# Patient Record
Sex: Male | Born: 2000 | Race: Black or African American | Hispanic: No | Marital: Single | Smoking: Never smoker
Health system: Southern US, Community
[De-identification: ages and names within clinical notes are randomized; demographics above are authoritative.]

---
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
## 2019-02-07 IMAGING — DX DG TIBIA/FIBULA 2V*L*
4 series · 4 of 4 positions shown · non-contrast
Comparison: None.

CLINICAL DATA: Patient hit left leg in a week ago. Left leg
swelling and painful to touch.

EXAM:
LEFT TIBIA AND FIBULA - 2 VIEW

[tibia ap (1 of 2)]
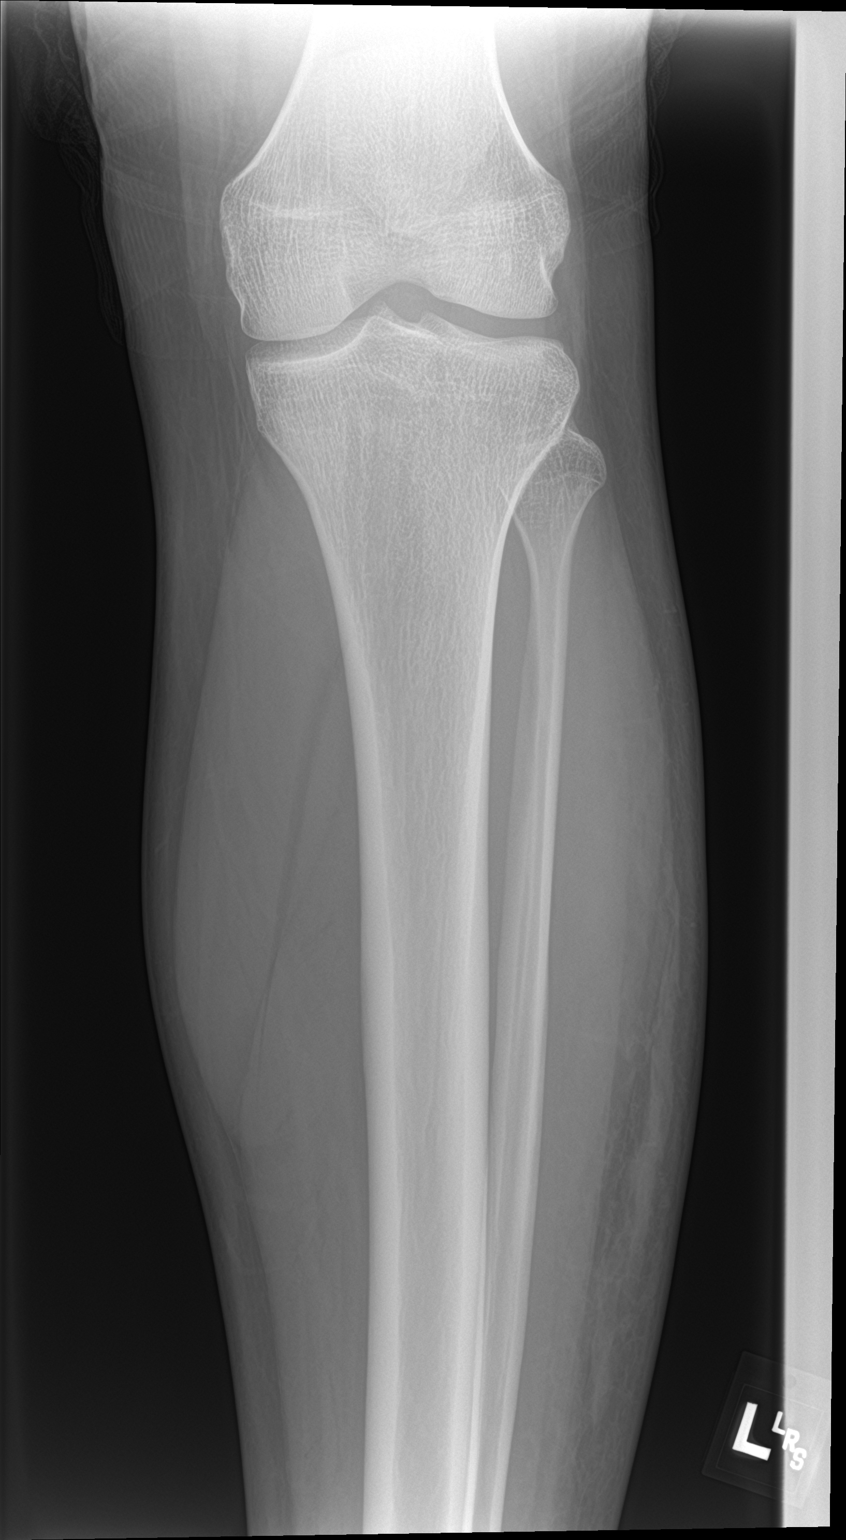

[tibia ap (2 of 2)]
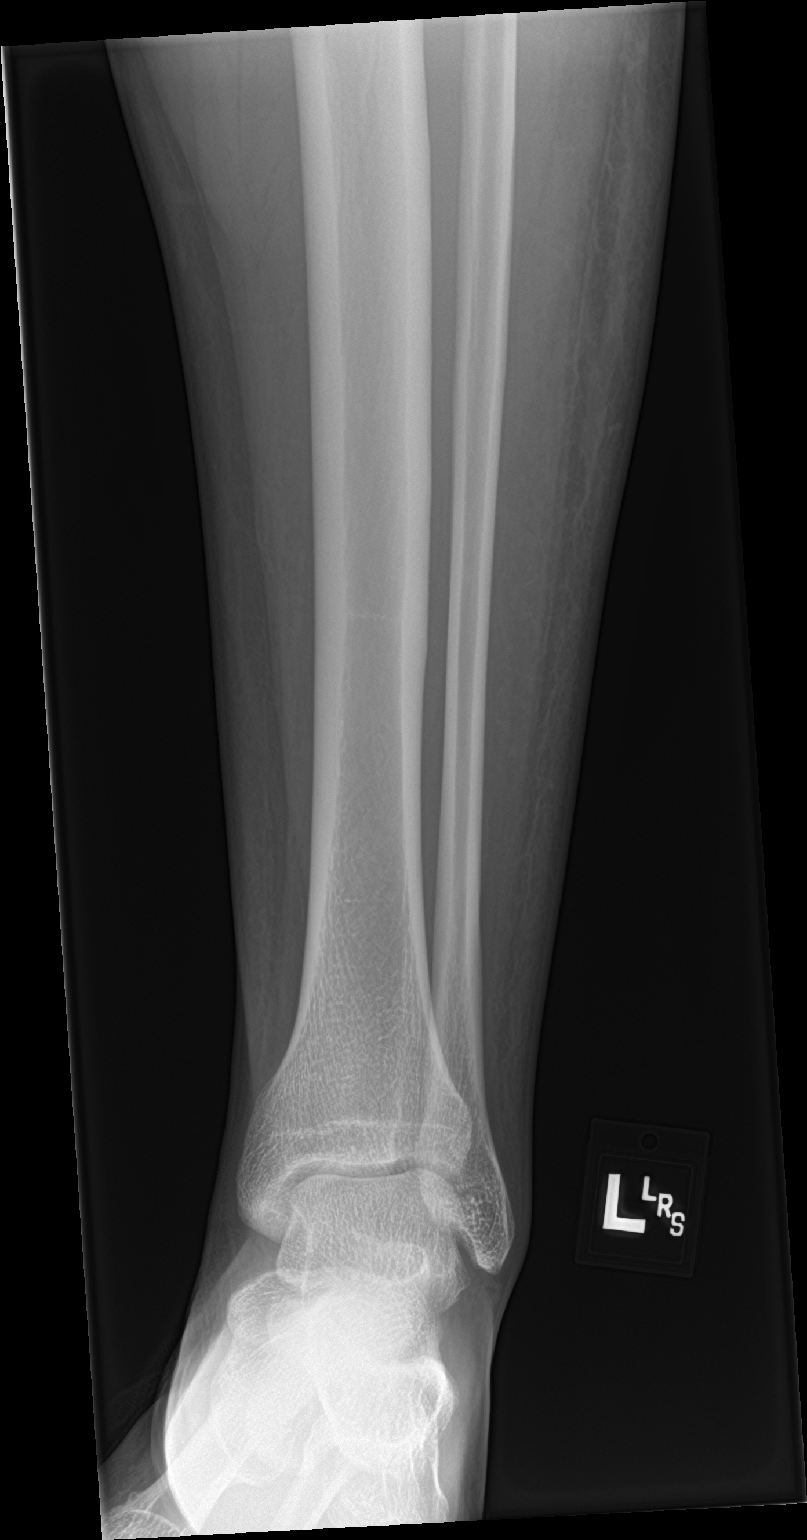

[tibia lat (1 of 2)]
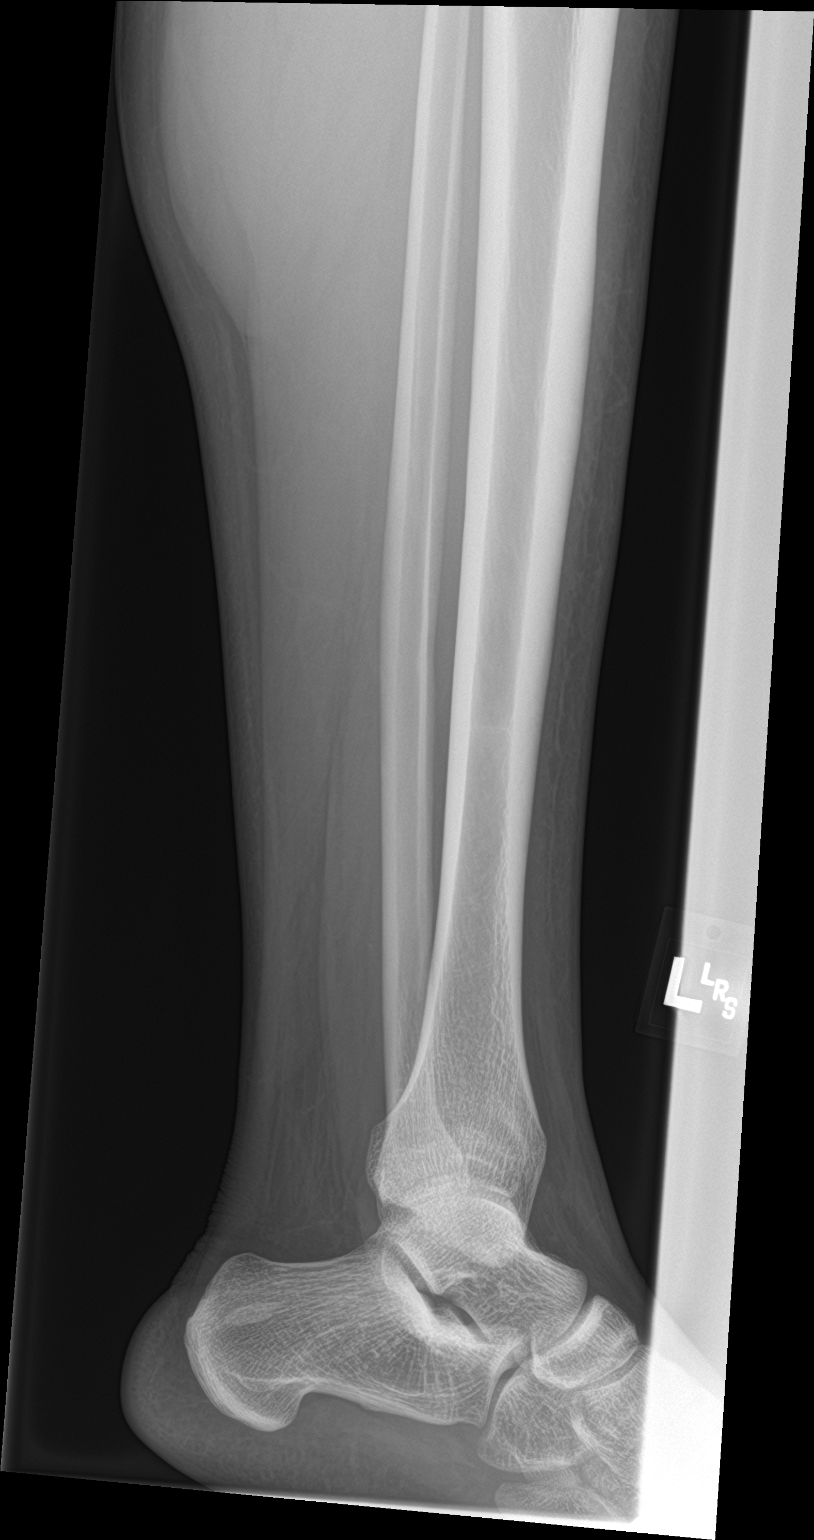

[tibia lat (2 of 2)]
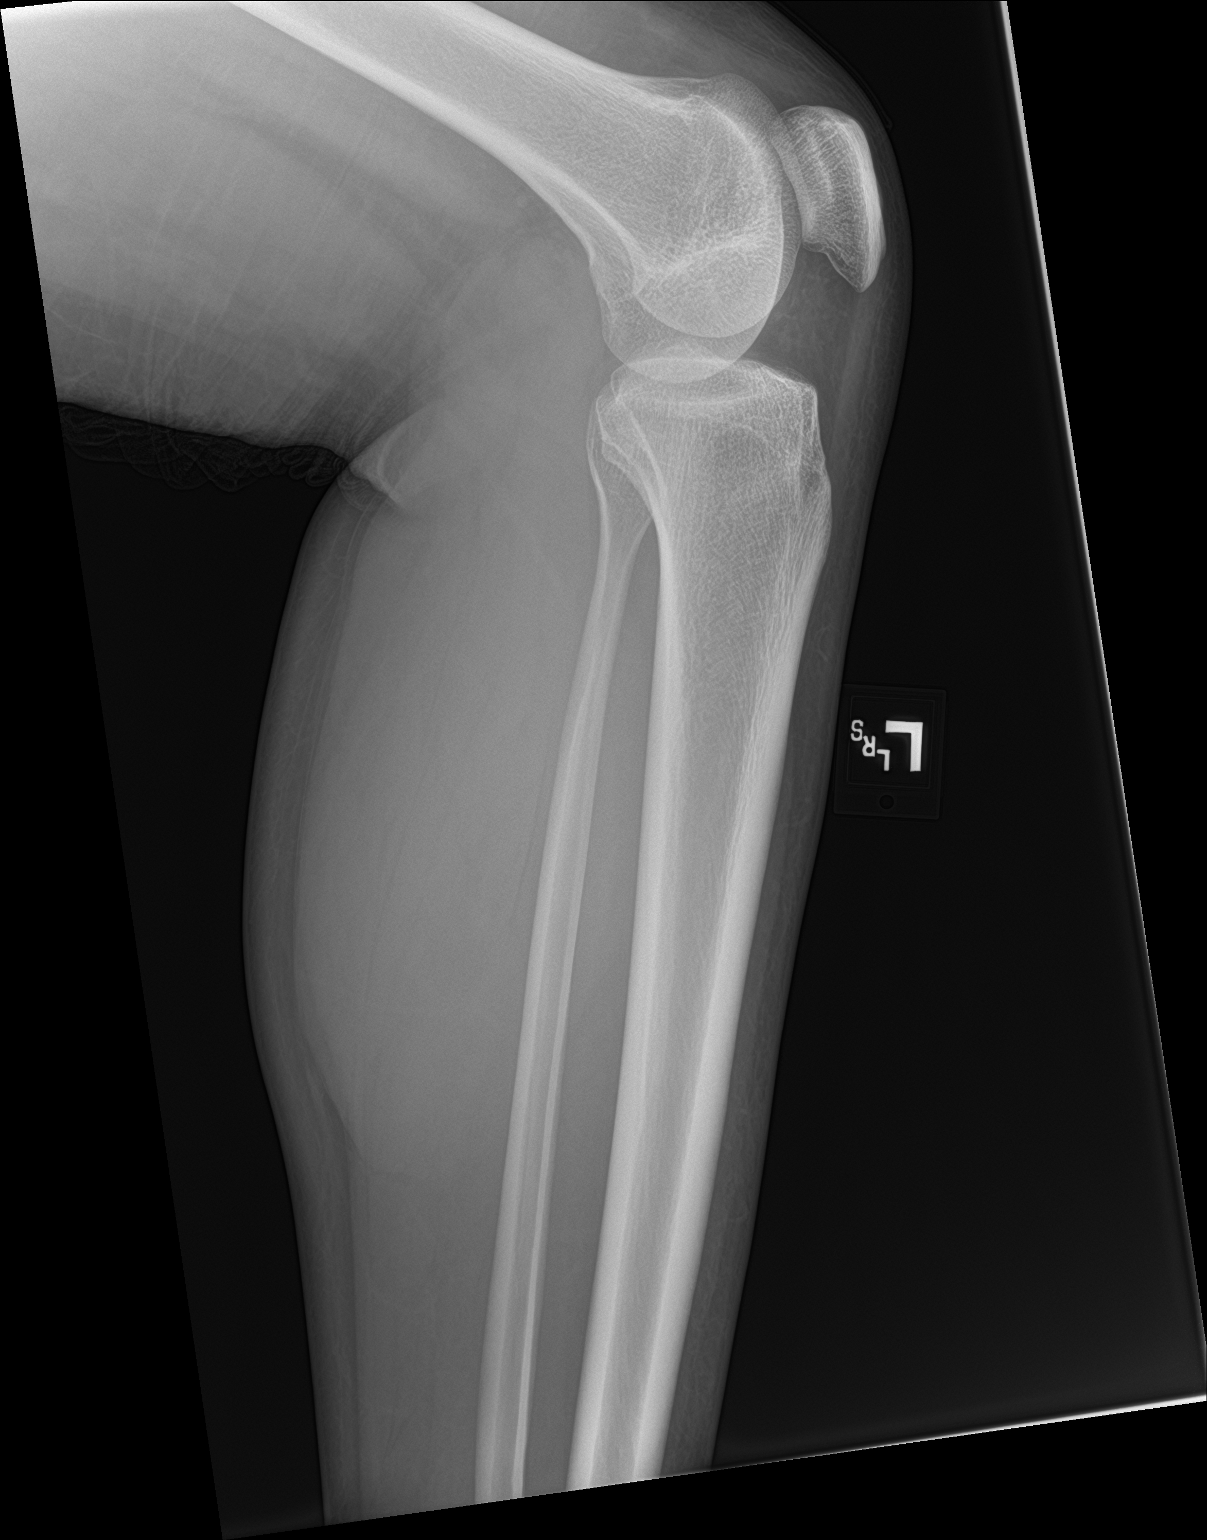

[4 of 4 positions shown; findings below may reference images not displayed]

FINDINGS: There is no evidence of fracture or other focal bone lesions. No
joint dislocations. Subcutaneous soft tissue induration along the
lateral aspect of the left leg consistent with contusion is
identified.
IMPRESSION: Left leg contusion along the lateral aspect. No acute underlying
osseous abnormality.

## 2021-02-25 ENCOUNTER — Other Ambulatory Visit: Payer: Self-pay | Admitting: Family Medicine

## 2021-02-25 DIAGNOSIS — G43909 Migraine, unspecified, not intractable, without status migrainosus: Secondary | ICD-10-CM

## 2021-05-12 ENCOUNTER — Encounter (HOSPITAL_COMMUNITY): Payer: Self-pay

## 2021-05-12 ENCOUNTER — Other Ambulatory Visit: Payer: Self-pay

## 2021-05-12 ENCOUNTER — Emergency Department (HOSPITAL_COMMUNITY)
Admission: EM | Admit: 2021-05-12 | Discharge: 2021-05-12 | Disposition: A | Discharge status: Home / Self Care | Payer: 59 | Attending: Emergency Medicine | Admitting: Emergency Medicine

## 2021-05-12 ENCOUNTER — Emergency Department (HOSPITAL_COMMUNITY): Payer: 59

## 2021-05-12 DIAGNOSIS — W25XXXD Contact with sharp glass, subsequent encounter: Secondary | ICD-10-CM | POA: Diagnosis not present

## 2021-05-12 DIAGNOSIS — S99922D Unspecified injury of left foot, subsequent encounter: Secondary | ICD-10-CM | POA: Diagnosis present

## 2021-05-12 DIAGNOSIS — S91332D Puncture wound without foreign body, left foot, subsequent encounter: Secondary | ICD-10-CM | POA: Diagnosis not present

## 2021-05-12 MED ORDER — LIDOCAINE HCL (PF) 1 % IJ SOLN
5.0000 mL | Freq: Once | INTRAMUSCULAR | Status: DC
  Filled 2021-05-12: qty 30

## 2021-05-12 NOTE — ED Triage Notes (Signed)
Patient states he stepped on glass last week and is concerned there is still a piece in his foot. PCP advised to go to the ED

## 2021-05-12 NOTE — Discharge Instructions (Addendum)
You were seen in the ER for your foot injury. Your physical exam and xray were very reassuring.  There is no sign of retained glass in your foot, and physical exam does not reveal any signs of infection.  As your pain is improving and your physical exam is reassuring, there is no indication for [place] to open the skin to wash out your wound. You may use Tylenol ibuprofen as needed.  Return to your primary care doctor or the emergency department develop any swelling, redness, puslike drainage from the wound, fevers, chills, or any other new severe symptoms.

## 2021-05-12 NOTE — ED Provider Notes (Signed)
[facility]-EMERGENCY DEPT Provider Note   CSN: [id number] Arrival date & time: 05/12/21  2107     History Chief Complaint  Patient presents with   Foot Pain    Fernando Alexander is a 20 y.o. male who presents with concern for piece of glass possibly stuck in his foot.  States that 7 days ago he stepped on something sharp that he suspects was glass with his left foot and the base of his pinky toe and has had intermittent pain since that time.  He states that he was seen by his primary care doctor and informed them that he was concerned that the "wound is closing over the piece of glass".  He states that his PCP did not even examine his foot today but directed him to the emergency department for x-rays.  He denies any redness, swelling, and discharge from the area but does endorse pain with direct pressure on it with walking.  He is unsure of his tetanus status, however per chart review he is up-to-date on his tetanus with most recent dose of Boostrix in December 2019.  Denies any fevers, chills, nausea, vomiting.  I personally read the patient medical records.  He is not carry medical diagnoses and is not on any medications every day.   HPI     History reviewed. No pertinent past medical history.  There are no problems to display for this patient.   History reviewed. No pertinent surgical history.     History reviewed. No pertinent family history.  Social History   Tobacco Use   Smoking status: Never    Home Medications Prior to Admission medications   Medication Sig Start Date End Date Taking? Authorizing Provider  naproxen (NAPROSYN) 250 MG tablet Take 1 tablet (250 mg total) by mouth 2 (two) times daily with a meal. 08/29/17   Everlene Farrier, PA-C    Allergies    Patient has no known allergies.  Review of Systems   Review of Systems  Constitutional: Negative.   HENT: Negative.    Respiratory: Negative.    Cardiovascular: Negative.    Gastrointestinal: Negative.   Skin:  Positive for wound.  Allergic/Immunologic: Negative.    Physical Exam Updated Vital Signs BP 138/83   Pulse 93   Temp 98.8 F (37.1 C) (Oral)   Resp 19   SpO2 99%   Physical Exam Vitals and nursing note reviewed.  HENT:     Head: Normocephalic and atraumatic.  Eyes:     General: No scleral icterus.       Right eye: No discharge.        Left eye: No discharge.     Conjunctiva/sclera: Conjunctivae normal.  Pulmonary:     Effort: Pulmonary effort is normal.  Musculoskeletal:       Feet:  Skin:    General: Skin is warm and dry.  Neurological:     General: No focal deficit present.     Mental Status: He is alert.     Sensory: Sensation is intact.     Motor: Motor function is intact.     Gait: Gait is intact.  Psychiatric:        Mood and Affect: Mood normal.    ED Results / Procedures / Treatments   Labs (all labs ordered are listed, but only abnormal results are displayed) Labs Reviewed - No data to display  EKG None  Radiology DG Foot Complete Left  Result Date: 05/12/2021 CLINICAL DATA:  Stepped on  glass EXAM: LEFT FOOT - COMPLETE 3+ VIEW COMPARISON:  None. FINDINGS: There is no evidence of fracture or dislocation. There is no evidence of arthropathy or other focal bone abnormality. Soft tissues are unremarkable. IMPRESSION: Negative. Electronically Signed   By: Jasmine Pang M.D.   On: 05/12/2021 22:03    Procedures Procedures   Medications Ordered in ED Medications  lidocaine (PF) (XYLOCAINE) 1 % injection 5 mL (has no administration in time range)    ED Course  I have reviewed the triage vital signs and the nursing notes.  Pertinent labs & imaging results that were available during my care of the patient were reviewed by me and considered in my medical decision making (see chart for details).    MDM Rules/Calculators/A&P                         20 year old male who was evaluated for pain at site of healing  superficial injury to the left foot x1 week.  Plain film of the foot was obtained in triage which was negative for retained foreign body or subcutaneous air/osseous injury.  Vital signs are normal intake.  Physical exam revealed well-healing wound to the plantar surface of the left foot near the distal head of the fifth metatarsal without redness, erythema, induration, or purulent drainage.  No tenderness palpation either.  Given reassuring physical exam and imaging, no further work-up is warranted in ED this time.  Patient is up-to-date on Boostrix.  Fernando voiced understanding with medical evaluation and treatment plan.  He was questions answered to his expressed satisfaction.  Return precautions are given.  Patient is well-appearing, stable, debris for discharge at this time.  This chart was dictated using voice recognition software, Dragon. Despite the best efforts of this provider to proofread and correct errors, errors may still occur which can change documentation meaning.  Final Clinical Impression(s) / ED Diagnoses Final diagnoses:  Penetrating wound of left foot, subsequent encounter    Rx / DC Orders ED Discharge Orders     None        Paris Lore, PA-C 05/13/21 0111    Virgina Norfolk, DO 05/13/21 2316

## 2022-10-21 IMAGING — DX DG FOOT COMPLETE 3+V*L*
3 series · 3 of 3 positions shown · non-contrast
Comparison: None.

CLINICAL DATA: Stepped on glass

EXAM:
LEFT FOOT - COMPLETE 3+ VIEW

[foot ap]
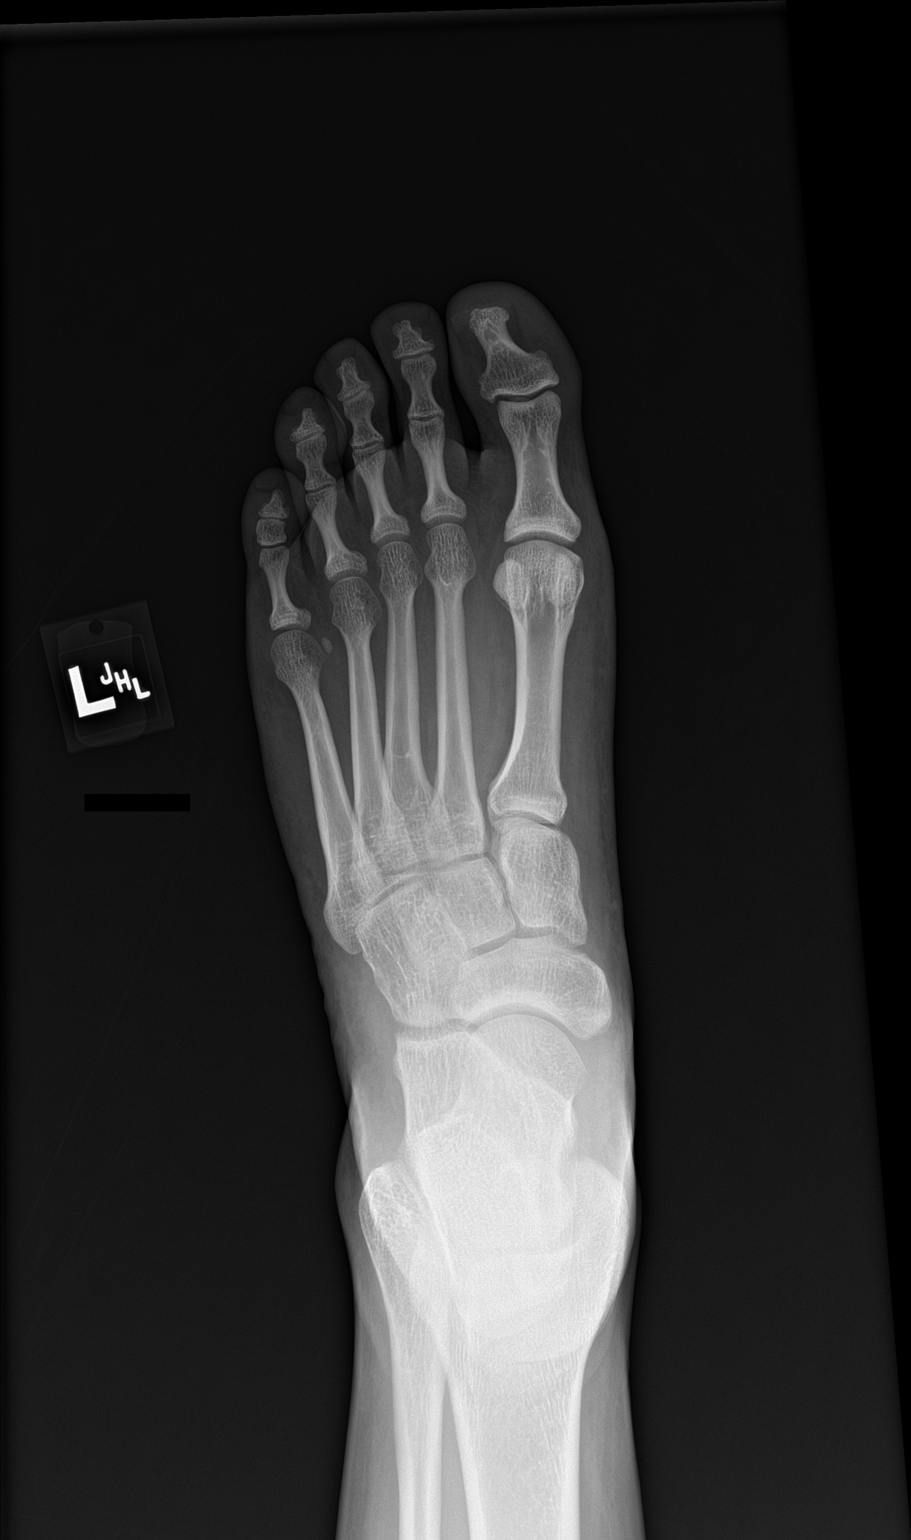

[foot obl]
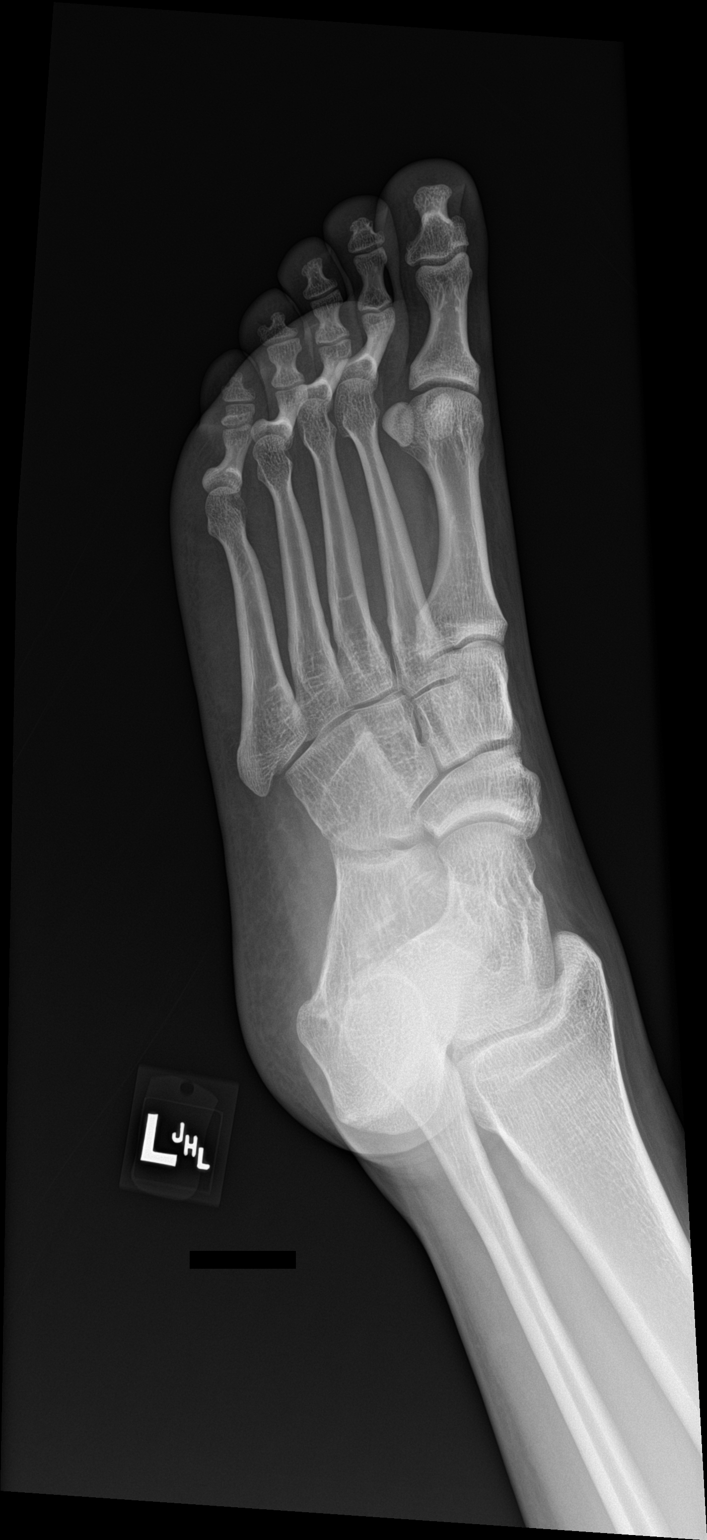

[foot lat]
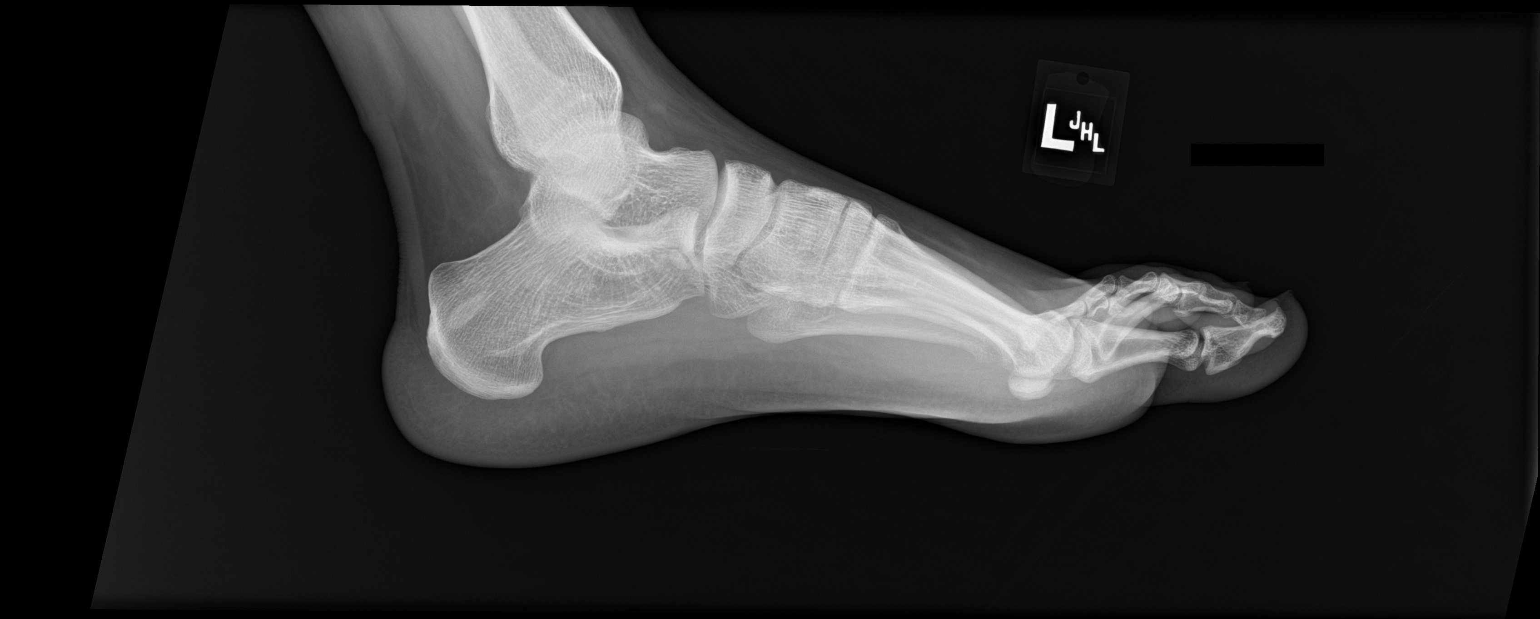

[3 of 3 positions shown; findings below may reference images not displayed]

FINDINGS: There is no evidence of fracture or dislocation. There is no
evidence of arthropathy or other focal bone abnormality. Soft
tissues are unremarkable.
IMPRESSION: Negative.

## 2022-12-30 ENCOUNTER — Emergency Department (HOSPITAL_BASED_OUTPATIENT_CLINIC_OR_DEPARTMENT_OTHER)
Admission: EM | Admit: 2022-12-30 | Discharge: 2022-12-30 | Disposition: A | Discharge status: Home / Self Care | Payer: BLUE CROSS/BLUE SHIELD | Attending: Emergency Medicine | Admitting: Emergency Medicine

## 2022-12-30 ENCOUNTER — Other Ambulatory Visit: Payer: Self-pay

## 2022-12-30 ENCOUNTER — Encounter (HOSPITAL_BASED_OUTPATIENT_CLINIC_OR_DEPARTMENT_OTHER): Payer: Self-pay | Admitting: Emergency Medicine

## 2022-12-30 DIAGNOSIS — R519 Headache, unspecified: Secondary | ICD-10-CM | POA: Insufficient documentation

## 2022-12-30 DIAGNOSIS — J0100 Acute maxillary sinusitis, unspecified: Secondary | ICD-10-CM

## 2022-12-30 DIAGNOSIS — R059 Cough, unspecified: Secondary | ICD-10-CM | POA: Insufficient documentation

## 2022-12-30 MED ORDER — AZITHROMYCIN 250 MG PO TABS: 250.0000 mg | ORAL_TABLET | Freq: Every day | ORAL | 0 refills | Status: AC

## 2022-12-30 MED ORDER — AZITHROMYCIN 250 MG PO TABS
500.0000 mg | ORAL_TABLET | Freq: Once | ORAL | Status: AC
  Administered 2022-12-30: 500 mg via ORAL
  Filled 2022-12-30: qty 2

## 2022-12-30 NOTE — ED Notes (Signed)
Patient verbalizes understanding of discharge instructions. Opportunity for questioning and answers were provided. Armband removed by staff, pt discharged from ED. Ambulated out to lobby with mother

## 2022-12-30 NOTE — ED Triage Notes (Signed)
Pt in with c/o facial pain and sinus pressure. States he thinks he got sick over the weekend, and now has a sinus infection

## 2022-12-30 NOTE — Discharge Instructions (Signed)
Begin taking Zithromax as prescribed.  Take ibuprofen 600 mg every 6 hours as needed for pain.  Take Sudafed as per package instructions for relief of congestion.  Follow-up with primary doctor if not improving in the next week, and return to the ER if symptoms significantly worsen or change.

## 2022-12-30 NOTE — ED Provider Notes (Signed)
  [facility] Provider Note   CSN: [id number] Arrival date & time: 12/30/22  0350     History  Chief Complaint  Patient presents with   Facial Pain    Fernando Alexander is a 22 y.o. male.  Patient is a 22 year old male presenting with complaints of sinus congestion and pressure worsening over the past week.  He describes cough and postnasal drip.  He denies any chest pain or difficulty breathing.  He denies any fevers or chills.  He is concerned he may have a sinus infection.  There are no aggravating or alleviating factors.  The history is provided by the patient.       Home Medications Prior to Admission medications   Medication Sig Start Date End Date Taking? Authorizing Provider  naproxen (NAPROSYN) 250 MG tablet Take 1 tablet (250 mg total) by mouth 2 (two) times daily with a meal. 08/29/17   Waynetta Pean, PA-C      Allergies    Patient has no known allergies.    Review of Systems   Review of Systems  All other systems reviewed and are negative.   Physical Exam Updated Vital Signs BP 124/76   Pulse 87   Temp 98 F (36.7 C) (Oral)   Resp 18   Wt 122.5 kg   SpO2 97%  Physical Exam Vitals and nursing note reviewed.  Constitutional:      General: He is not in acute distress.    Appearance: He is well-developed. He is not diaphoretic.  HENT:     Head: Normocephalic and atraumatic.     Comments: There is maxillofacial tenderness to palpation, left greater than right.    Mouth/Throat:     Mouth: Mucous membranes are moist.     Pharynx: No oropharyngeal exudate or posterior oropharyngeal erythema.  Cardiovascular:     Rate and Rhythm: Normal rate and regular rhythm.     Heart sounds: No murmur heard.    No friction rub.  Pulmonary:     Effort: Pulmonary effort is normal. No respiratory distress.     Breath sounds: Normal breath sounds. No wheezing or rales.  Musculoskeletal:        General: Normal range of motion.      Cervical back: Normal range of motion and neck supple.  Skin:    General: Skin is warm and dry.  Neurological:     Mental Status: He is alert and oriented to person, place, and time.     Coordination: Coordination normal.     ED Results / Procedures / Treatments   Labs (all labs ordered are listed, but only abnormal results are displayed) Labs Reviewed - No data to display  EKG None  Radiology No results found.  Procedures Procedures    Medications Ordered in ED Medications - No data to display  ED Course/ Medical Decision Making/ A&P  Patient with a 1 week history of nasal congestion and cough now having facial pain.  Presentation consistent with sinusitis.  Patient to be treated with Zithromax, decongestants, ibuprofen, and follow-up as needed.  Final Clinical Impression(s) / ED Diagnoses Final diagnoses:  None    Rx / DC Orders ED Discharge Orders     None         Veryl Speak, MD 12/30/22 [phone]
